# Patient Record
Sex: Male | Born: 1990 | Race: White | Hispanic: Yes | Marital: Single | Smoking: Current every day smoker
Health system: Southern US, Community
[De-identification: ages and names within clinical notes are randomized; demographics above are authoritative.]

---

## 2008-09-09 ENCOUNTER — Emergency Department (HOSPITAL_COMMUNITY): Admission: EM | Admit: 2008-09-09 | Discharge: 2008-09-09 | Payer: Self-pay | Admitting: Emergency Medicine
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
## 2016-08-15 ENCOUNTER — Encounter (HOSPITAL_COMMUNITY): Payer: Self-pay | Admitting: Family Medicine

## 2016-08-15 ENCOUNTER — Ambulatory Visit (HOSPITAL_COMMUNITY)
Admission: EM | Admit: 2016-08-15 | Discharge: 2016-08-15 | Disposition: A | Discharge status: Home / Self Care | Payer: Self-pay | Attending: Internal Medicine | Admitting: Internal Medicine

## 2016-08-15 DIAGNOSIS — R21 Rash and other nonspecific skin eruption: Secondary | ICD-10-CM

## 2016-08-15 MED ORDER — METHYLPREDNISOLONE SODIUM SUCC 125 MG IJ SOLR
125.0000 mg | Freq: Once | INTRAMUSCULAR | Status: AC
  Administered 2016-08-15: 125 mg via INTRAMUSCULAR

## 2016-08-15 MED ORDER — METHYLPREDNISOLONE SODIUM SUCC 125 MG IJ SOLR
INTRAMUSCULAR | Status: AC
  Filled 2016-08-15: qty 2

## 2016-08-15 MED ORDER — PREDNISONE 10 MG PO TABS: ORAL_TABLET | ORAL | 0 refills | Status: DC

## 2016-08-15 NOTE — ED Notes (Signed)
Updated birthdate

## 2016-08-15 NOTE — ED Triage Notes (Signed)
Pt here for rash behind legs, on face and bumps on body x 1 week.

## 2016-08-17 NOTE — ED Provider Notes (Signed)
[facility]-EMERGENCY DEPT Provider Note   CSN: [id number] Arrival date & time: 08/15/16  1808     History   Chief Complaint Chief Complaint  Patient presents with  . Rash    HPI Donald Booker is a 26 y.o. male.  The history is provided by the patient. No language interpreter was used.  Rash   This is a new problem. The current episode started yesterday. The problem has not changed since onset.The problem is associated with nothing. There has been no fever. The rash is present on the scalp, head, torso, back, abdomen and trunk. The patient is experiencing no pain. The pain has been constant since onset. He has tried nothing for the symptoms.  Pt has a full body rash,  Rash is worse behind legs, He has raised areas around eyes. Rash behind knees is red and draining.    History reviewed. No pertinent past medical history.  There are no active problems to display for this patient.   History reviewed. No pertinent surgical history.     Home Medications    Prior to Admission medications   Medication Sig Start Date End Date Taking? Authorizing Provider  predniSONE (DELTASONE) 10 MG tablet 6,6,5,5,4,4,3,3,2,2,1,1 taper 08/15/16   Elson Areas, PA-C    Family History History reviewed. No pertinent family history.  Social History Social History  Substance Use Topics  . Smoking status: Never Smoker  . Smokeless tobacco: Never Used  . Alcohol use Not on file     Allergies   Patient has no known allergies.   Review of Systems Review of Systems  Skin: Positive for rash.  All other systems reviewed and are negative.    Physical Exam Updated Vital Signs BP 122/70 (BP Location: Right Arm)   Pulse 81   Temp 97.8 F (36.6 C) (Oral)   Resp 14   SpO2 100%   Physical Exam  Constitutional: He appears well-developed and well-nourished.  HENT:  Head: Normocephalic and atraumatic.  Eyes: Conjunctivae are normal.  swlling and rash around eyes,  No sign of infection   Neck: Neck supple.  Cardiovascular: Normal rate and regular rhythm.   No murmur heard. Pulmonary/Chest: Effort normal and breath sounds normal. No respiratory distress.  Abdominal: There is no tenderness.  Musculoskeletal: He exhibits no edema.  Neurological: He is alert.  Skin: Skin is warm.  Open red areas behind knees,  Rash red, raised full body.  Psychiatric: He has a normal mood and affect.  Nursing note and vitals reviewed.    ED Treatments / Results  Labs (all labs ordered are listed, but only abnormal results are displayed) Labs Reviewed - No data to display  EKG  EKG Interpretation None       Radiology No results found.  Procedures Procedures (including critical care time)  Medications Ordered in ED Medications  methylPREDNISolone sodium succinate (SOLU-MEDROL) 125 mg/2 mL injection 125 mg (125 mg Intramuscular Given 08/15/16 1927)     Initial Impression / Assessment and Plan / ED Course  I have reviewed the triage vital signs and the nursing notes.  Pertinent labs & imaging results that were available during my care of the patient were reviewed by me and considered in my medical decision making (see chart for details).     Pt is not sick.  I suspect contact dermatitis or atrophic dermatitis.  Pt given injection of solumedrol.  I will treat with a dose pack.  Final Clinical Impressions(s) / ED Diagnoses   Final diagnoses:  Rash and nonspecific skin eruption    New Prescriptions Discharge Medication List as of 08/15/2016  7:20 PM    START taking these medications   Details  predniSONE (DELTASONE) 10 MG tablet 6,6,5,5,4,4,3,3,2,2,1,1 taper, Normal      An After Visit Summary was printed and given to the patient.    Lonia Skinner [place], PA-C 08/17/16 1409

## 2016-09-03 ENCOUNTER — Encounter (HOSPITAL_COMMUNITY): Payer: Self-pay

## 2016-09-03 ENCOUNTER — Emergency Department (HOSPITAL_COMMUNITY)
Admission: EM | Admit: 2016-09-03 | Discharge: 2016-09-03 | Disposition: A | Discharge status: Home / Self Care | Payer: Self-pay | Attending: Emergency Medicine | Admitting: Emergency Medicine

## 2016-09-03 DIAGNOSIS — F17210 Nicotine dependence, cigarettes, uncomplicated: Secondary | ICD-10-CM | POA: Insufficient documentation

## 2016-09-03 DIAGNOSIS — R21 Rash and other nonspecific skin eruption: Secondary | ICD-10-CM | POA: Insufficient documentation

## 2016-09-03 MED ORDER — PREDNISONE 20 MG PO TABS: 60.0000 mg | ORAL_TABLET | Freq: Every day | ORAL | 0 refills | Status: AC

## 2016-09-03 MED ORDER — MUPIROCIN CALCIUM 2 % EX CREA
TOPICAL_CREAM | Freq: Two times a day (BID) | CUTANEOUS | Status: DC
  Administered 2016-09-03: 22:00:00 via TOPICAL
  Filled 2016-09-03: qty 15

## 2016-09-03 MED ORDER — PREDNISONE 20 MG PO TABS
60.0000 mg | ORAL_TABLET | Freq: Once | ORAL | Status: AC
  Administered 2016-09-03: 60 mg via ORAL
  Filled 2016-09-03: qty 3

## 2016-09-03 NOTE — ED Notes (Signed)
Patient Alert and oriented X4. Stable and ambulatory. Patient verbalized understanding of the discharge instructions.  Patient belongings were taken by the patient.  

## 2016-09-03 NOTE — ED Triage Notes (Signed)
Pt seen 08-15-16 at u/c for widespread rash.  Worse behind bilateral knees, shoulders, behind neck, and around both eyes.  Itches.  Finished Prednisone 1 week ago.

## 2016-09-03 NOTE — Discharge Instructions (Signed)
You have been given a tube of Bactroban please apply this to the back of your knee twice a day for the next 5-7 days until the wound clears. You've also been given a prescription for prednisone 60 mg daily for the next 6 days.  He was given the first dose in the emergency department.

## 2016-09-03 NOTE — ED Provider Notes (Signed)
[facility]-EMERGENCY DEPT Provider Note   CSN: [id number] Arrival date & time: 09/03/16  2016     History   Chief Complaint Chief Complaint  Patient presents with  . Rash    HPI Donald Booker is a 26 y.o. male.  Rash started behind knees, spread to generalized body, + Itch on 4/17 was seen and Dx with non-specific rash and given Prednisone taper-- was getting better until stopped steroid.  Now behind R knee area is weeping clear fluid-- patient states he scratched it open.o rash is periorbital  - patient feels this is from rubbing his eyes after scratching the rash on his body. 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 No pertinent past medical history.  There are no active problems to display for this patient.   History reviewed. No pertinent surgical history.     Home Medications    Prior to Admission medications   Medication Sig Start Date End Date Taking? Authorizing Provider  predniSONE (DELTASONE) 20 MG tablet Take 3 tablets (60 mg total) by mouth daily with breakfast. 09/03/16 09/09/16  Earley FavorSchulz, Angella Montas, NP    Family History History reviewed. No pertinent family history.  Social History Social History  Substance Use Topics  . Smoking status: Current Every Day Smoker    Packs/day: 0.50    Types: Cigarettes  . Smokeless tobacco: Never Used  . Alcohol use No     Allergies   Patient has no known allergies.   Review of Systems Review of Systems  Respiratory: Negative for shortness of breath.   Skin: Positive for rash and wound.  All other systems reviewed and are negative.    Physical Exam Updated Vital Signs BP 128/83   Pulse 88   Temp 97.7 F (36.5 C) (Oral)   Resp 18   SpO2 99%   Physical Exam  Constitutional: He appears well-developed and well-nourished. No distress.  HENT:  Head: Normocephalic.  Eyes: Conjunctivae and EOM are normal. Pupils are equal, round, and reactive to light.    Neck: Normal  range of motion.  Cardiovascular: Normal rate.   Pulmonary/Chest: Effort normal.  Abdominal: Soft.  Musculoskeletal: Normal range of motion.  Neurological: He is alert.  Skin: Skin is warm. Rash noted.     Nursing note and vitals reviewed.    ED Treatments / Results  Labs (all labs ordered are listed, but only abnormal results are displayed) Labs Reviewed - No data to display  EKG  EKG Interpretation None       Radiology No results found.  Procedures Procedures (including critical care time)  Medications Ordered in ED Medications  mupirocin cream (BACTROBAN) 2 % (not administered)  predniSONE (DELTASONE) tablet 60 mg (60 mg Oral Given 09/03/16 2134)     Initial Impression / Assessment and Plan / ED Course  I have reviewed the triage vital signs and the nursing notes.  Pertinent labs & imaging results that were available during my care of the patient were reviewed by me and considered in my medical decision making (see chart for details).      Seven-day burst of 60 mg prednisone as well as Bactroban for weeping wound behind right knee.  Final Clinical Impressions(s) / ED Diagnoses   Final diagnoses:  Rash and nonspecific skin eruption    New Prescriptions New Prescriptions   PREDNISONE (DELTASONE) 20 MG TABLET    Take 3 tablets (60 mg total) by mouth daily with breakfast.  Earley Favor, NP 09/03/16 1610    Rolland Porter, MD 09/15/16 [phone]

## 2016-10-02 ENCOUNTER — Encounter (HOSPITAL_COMMUNITY): Payer: Self-pay | Admitting: Vascular Surgery

## 2016-10-02 ENCOUNTER — Emergency Department (HOSPITAL_COMMUNITY)
Admission: EM | Admit: 2016-10-02 | Discharge: 2016-10-02 | Disposition: A | Discharge status: Home / Self Care | Payer: Self-pay | Attending: Emergency Medicine | Admitting: Emergency Medicine

## 2016-10-02 DIAGNOSIS — Y929 Unspecified place or not applicable: Secondary | ICD-10-CM | POA: Insufficient documentation

## 2016-10-02 DIAGNOSIS — Y939 Activity, unspecified: Secondary | ICD-10-CM | POA: Insufficient documentation

## 2016-10-02 DIAGNOSIS — F17210 Nicotine dependence, cigarettes, uncomplicated: Secondary | ICD-10-CM | POA: Insufficient documentation

## 2016-10-02 DIAGNOSIS — S80861A Insect bite (nonvenomous), right lower leg, initial encounter: Secondary | ICD-10-CM | POA: Insufficient documentation

## 2016-10-02 DIAGNOSIS — Y999 Unspecified external cause status: Secondary | ICD-10-CM | POA: Insufficient documentation

## 2016-10-02 DIAGNOSIS — L03115 Cellulitis of right lower limb: Secondary | ICD-10-CM | POA: Insufficient documentation

## 2016-10-02 DIAGNOSIS — W57XXXA Bitten or stung by nonvenomous insect and other nonvenomous arthropods, initial encounter: Secondary | ICD-10-CM | POA: Insufficient documentation

## 2016-10-02 MED ORDER — SULFAMETHOXAZOLE-TRIMETHOPRIM 800-160 MG PO TABS
1.0000 | ORAL_TABLET | Freq: Once | ORAL | Status: AC
  Administered 2016-10-02: 1 via ORAL
  Filled 2016-10-02: qty 1

## 2016-10-02 MED ORDER — OXYCODONE-ACETAMINOPHEN 5-325 MG PO TABS
1.0000 | ORAL_TABLET | Freq: Once | ORAL | Status: AC
  Administered 2016-10-02: 1 via ORAL
  Filled 2016-10-02: qty 1

## 2016-10-02 MED ORDER — SULFAMETHOXAZOLE-TRIMETHOPRIM 800-160 MG PO TABS: 1.0000 | ORAL_TABLET | Freq: Two times a day (BID) | ORAL | 0 refills | Status: AC

## 2016-10-02 MED ORDER — IBUPROFEN 200 MG PO TABS
600.0000 mg | ORAL_TABLET | Freq: Once | ORAL | Status: AC
  Administered 2016-10-02: 600 mg via ORAL
  Filled 2016-10-02: qty 1

## 2016-10-02 NOTE — ED Notes (Signed)
Pt stable, ambulatory, states understanding of discharge instructions 

## 2016-10-02 NOTE — ED Triage Notes (Signed)
Pt reports to the ED for eval of insect bite to right lateral calf. He reports he sustained the bite on Friday and he states that the area has become more erythematous, swollen, and painful. It is also draining serosanguineous fluid. He denies any systemic symptoms such as fevers, chills, or N/V. Tried some OTC cream for the bite but it did not help.

## 2016-10-02 NOTE — ED Provider Notes (Signed)
[facility]-EMERGENCY DEPT Provider Note   CSN: [id number] Arrival date & time: 10/02/16  1737  By signing my name below, I, Phillips Climes, attest that this documentation has been prepared under the direction and in the presence of Raeford Razor, MD . Electronically Signed: Phillips Climes, Scribe. 10/02/2016. 6:57 PM.  History   Chief Complaint Chief Complaint  Patient presents with  . Insect Bite   HPI Comments Donald Booker is a 26 y.o. male with no reported PMHx, who presents to the Emergency Department with complaints of an insect bite to his right calf x1.5 weeks.  He additionally endorses redness and erythema to the area, which is currently draining fluid.  He reports that his sx have worsened since onset, pain currently rated a 8/10 in severity.  No fevers, chills, nausea or vomiting.  Sensation intact to feet and pt is able to ambulate without complaint or difficulty.  Pt has not attempted any OTC symptomatic management before presenting today for evaluation.  The history is provided by the patient and medical records. No language interpreter was used.   History reviewed. No pertinent past medical history.  There are no active problems to display for this patient.  History reviewed. No pertinent surgical history.   Home Medications    Prior to Admission medications   Not on File    Family History No family history on file.  Social History Social History  Substance Use Topics  . Smoking status: Current Every Day Smoker    Packs/day: 0.50    Types: Cigarettes  . Smokeless tobacco: Never Used  . Alcohol use No     Allergies   Patient has no known allergies.   Review of Systems Review of Systems  Constitutional: Negative for chills and fever.  Gastrointestinal: Negative for nausea and vomiting.  Skin: Positive for wound.   Physical Exam Updated Vital Signs BP 123/74 (BP Location: Right Arm)   Pulse 94   Temp 98.9 F (37.2 C) (Oral)   Resp 16    SpO2 97%   Physical Exam  Constitutional: He is oriented to person, place, and time. He appears well-developed and well-nourished.  HENT:  Head: Normocephalic and atraumatic.  Eyes: EOM are normal.  Neck: Normal range of motion.  Cardiovascular: Normal rate, regular rhythm, normal heart sounds and intact distal pulses.   Pulmonary/Chest: Effort normal and breath sounds normal. No respiratory distress.  Abdominal: Soft. He exhibits no distension. There is no tenderness.  Musculoskeletal: Normal range of motion.  Neurological: He is alert and oriented to person, place, and time.  Skin: Skin is warm and dry.  Small ulceration at the lateral aspect of  mid right shin.  Minimal purulence spontaneously draining.  No fluctuance. About 3cm of circumferential cellulitis surrounding it.  Psychiatric: He has a normal mood and affect. Judgment normal.  Nursing note and vitals reviewed.  ED Treatments / Results  DIAGNOSTIC STUDIES: Oxygen Saturation is 97% on room air, adequate by my interpretation.    COORDINATION OF CARE: 6:57 PM Discussed treatment plan with pt at bedside and pt agreed to plan.  Labs (all labs ordered are listed, but only abnormal results are displayed) Labs Reviewed - No data to display  EKG  EKG Interpretation None       Radiology No results found.  Procedures Procedures (including critical care time)  Medications Ordered in ED Medications - No data to display   Initial Impression / Assessment and Plan / ED Course  I have reviewed  the triage vital signs and the nursing notes.  Pertinent labs & imaging results that were available during my care of the patient were reviewed by me and considered in my medical decision making (see chart for details).     Cellulitis to R leg. Small abscess which is spontaneously draining. I don't think trying to I&D further would be of benefit. Wound care. sitz baths. Abx. Prn NSAIDs. It has been determined that no acute  conditions requiring further emergency intervention are present at this time. The patient has been advised of the diagnosis and plan. I reviewed any labs and imaging including any potential incidental findings. We have discussed signs and symptoms that warrant return to the ED and they are listed in the discharge instructions.    Final Clinical Impressions(s) / ED Diagnoses   Final diagnoses:  Cellulitis of right lower extremity   I personally preformed the services scribed in my presence. The recorded information has been reviewed is accurate. Raeford RazorStephen Ezzie Senat, MD.   New Prescriptions New Prescriptions   No medications on file     Raeford RazorKohut, Kent Braunschweig, MD 10/09/16 1049

## 2018-05-21 ENCOUNTER — Emergency Department (HOSPITAL_COMMUNITY)
Admission: EM | Admit: 2018-05-21 | Discharge: 2018-05-21 | Disposition: A | Discharge status: Home / Self Care | Payer: Self-pay | Attending: Emergency Medicine | Admitting: Emergency Medicine

## 2018-05-21 ENCOUNTER — Emergency Department (HOSPITAL_COMMUNITY): Payer: Self-pay

## 2018-05-21 ENCOUNTER — Encounter (HOSPITAL_COMMUNITY): Payer: Self-pay | Admitting: Emergency Medicine

## 2018-05-21 DIAGNOSIS — F17210 Nicotine dependence, cigarettes, uncomplicated: Secondary | ICD-10-CM | POA: Insufficient documentation

## 2018-05-21 DIAGNOSIS — Y9389 Activity, other specified: Secondary | ICD-10-CM | POA: Insufficient documentation

## 2018-05-21 DIAGNOSIS — S43102A Unspecified dislocation of left acromioclavicular joint, initial encounter: Secondary | ICD-10-CM | POA: Insufficient documentation

## 2018-05-21 DIAGNOSIS — Y929 Unspecified place or not applicable: Secondary | ICD-10-CM | POA: Insufficient documentation

## 2018-05-21 DIAGNOSIS — Y999 Unspecified external cause status: Secondary | ICD-10-CM | POA: Insufficient documentation

## 2018-05-21 MED ORDER — HYDROCODONE-ACETAMINOPHEN 5-325 MG PO TABS: 1.0000 | ORAL_TABLET | Freq: Four times a day (QID) | ORAL | 0 refills | Status: AC | PRN

## 2018-05-21 MED ORDER — IBUPROFEN 400 MG PO TABS
600.0000 mg | ORAL_TABLET | Freq: Once | ORAL | Status: AC
  Administered 2018-05-21: 600 mg via ORAL
  Filled 2018-05-21: qty 1

## 2018-05-21 MED ORDER — HYDROCODONE-ACETAMINOPHEN 5-325 MG PO TABS
1.0000 | ORAL_TABLET | Freq: Once | ORAL | Status: AC
  Administered 2018-05-21: 1 via ORAL
  Filled 2018-05-21: qty 1

## 2018-05-21 MED ORDER — IBUPROFEN 600 MG PO TABS: 600.0000 mg | ORAL_TABLET | Freq: Four times a day (QID) | ORAL | 0 refills | Status: AC | PRN

## 2018-05-21 MED ORDER — METHOCARBAMOL 500 MG PO TABS: 500.0000 mg | ORAL_TABLET | Freq: Two times a day (BID) | ORAL | 0 refills | Status: AC
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
## 2018-05-21 NOTE — Discharge Instructions (Signed)
There was an abnormality in the left shoulder known as an acromioclavicular separation.  The shoulder immobilizer should be worn at all times when not showering.  Expect your soreness to increase over the next 2-3 days. Take it easy, but do not lay around too much as this may make any stiffness worse.  Antiinflammatory medications: Take 600 mg of ibuprofen every 6 hours or 440 mg (over the counter dose) to 500 mg (prescription dose) of naproxen every 12 hours for the next 3 days. After this time, these medications may be used as needed for pain. Take these medications with food to avoid upset stomach. Choose only one of these medications, do not take them together. Acetaminophen (generic for Tylenol): Should you continue to have additional pain while taking the ibuprofen or naproxen, you may add in acetaminophen as needed. Your daily total maximum amount of acetaminophen from all sources should be limited to 4000mg /day for persons without liver problems, or 2000mg /day for those with liver problems. Vicodin: May take Vicodin (hydrocodone-acetaminophen) as needed for severe pain.  Do not drive or perform other dangerous activities while taking the Vicodin.  Please note that each pill of Vicodin contains 325 mg of acetaminophen (Tylenol) and the above dosage limits apply. Muscle relaxer: Robaxin is a muscle relaxer and may help loosen stiff muscles. Do not take the Robaxin while driving or performing other dangerous activities.  Follow up: Follow-up with the orthopedic specialist as soon as possible on this matter.  Call the number provided to set up an appointment.  You should be seen within the next week or two. Return: Return to the ED should symptoms worsen, you have numbness, weakness, or any other major concerns.  For prescription assistance, may try using prescription discount sites or apps, such as goodrx.com
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
